# Patient Record
Sex: Female | Born: 2000 | Race: White | Hispanic: No | Marital: Single | Smoking: Never smoker
Health system: Southern US, Community
[De-identification: ages and names within clinical notes are randomized; demographics above are authoritative.]

## PROBLEM LIST (undated history)

## (undated) DIAGNOSIS — J329 Chronic sinusitis, unspecified: Secondary | ICD-10-CM

## (undated) HISTORY — PX: NO PAST SURGERIES: SHX2092

## (undated) HISTORY — PX: NASAL SINUS SURGERY: SHX719

---

## 2019-07-12 ENCOUNTER — Other Ambulatory Visit: Payer: Self-pay

## 2019-07-12 DIAGNOSIS — Z20822 Contact with and (suspected) exposure to covid-19: Secondary | ICD-10-CM

## 2019-07-13 LAB — NOVEL CORONAVIRUS, NAA: SARS-CoV-2, NAA: NOT DETECTED

## 2019-07-13 LAB — SPECIMEN STATUS REPORT

## 2019-09-08 ENCOUNTER — Emergency Department
Admission: EM | Admit: 2019-09-08 | Discharge: 2019-09-08 | Disposition: A | Discharge status: Home / Self Care | Payer: BLUE CROSS/BLUE SHIELD | Attending: Emergency Medicine | Admitting: Emergency Medicine

## 2019-09-08 ENCOUNTER — Other Ambulatory Visit: Payer: Self-pay

## 2019-09-08 DIAGNOSIS — R300 Dysuria: Secondary | ICD-10-CM | POA: Diagnosis present

## 2019-09-08 DIAGNOSIS — N3001 Acute cystitis with hematuria: Secondary | ICD-10-CM | POA: Insufficient documentation

## 2019-09-08 LAB — URINALYSIS, COMPLETE (UACMP) WITH MICROSCOPIC
Bilirubin Urine: NEGATIVE
Glucose, UA: NEGATIVE mg/dL
Ketones, ur: NEGATIVE mg/dL
Nitrite: POSITIVE — AB
Protein, ur: 30 mg/dL — AB
RBC / HPF: >50 RBC/hpf — ABNORMAL HIGH (ref 0–5)
Specific Gravity, Urine: 1.008 (ref 1.005–1.030)
WBC, UA: >50 WBC/hpf — ABNORMAL HIGH (ref 0–5)
pH: 6 (ref 5.0–8.0)

## 2019-09-08 LAB — POCT PREGNANCY, URINE: Preg Test, Ur: NEGATIVE

## 2019-09-08 MED ORDER — CEPHALEXIN 500 MG PO CAPS: 500.0000 mg | ORAL_CAPSULE | Freq: Three times a day (TID) | ORAL | 0 refills | Status: AC

## 2019-09-08 NOTE — ED Notes (Signed)
Pt currently taking azo for feelings of a uti. Urine obtained, dipped and sent to lab

## 2019-09-08 NOTE — ED Triage Notes (Signed)
Pt to the er for possible UTI. Pt says she took AZO at home and is providing some relief. Pt reports bloated and heavy bladder, urinary frequency and urgency with little out put and burning. Pt is in no acute distress.

## 2019-09-08 NOTE — ED Provider Notes (Signed)
[facility] Emergency Department Provider Note  ____________________________________________  Time seen: Approximately 9:01 PM  I have reviewed the triage vital signs and the nursing notes.   HISTORY  Chief Complaint Urinary Tract Infection    HPI Janet Cole is a 18 y.o. female presents to the emergency department with dysuria, hematuria and increased urinary frequency for the past 24 hours.  Patient reports some suprapubic discomfort but no low back pain, nausea or vomiting.  She states that she has absolutely no concerns for STDs.  Patient reports that it has been several months since she had a urinary tract infection.  No prior history of pyelonephritis or nephrolithiasis.  No other alleviating measures have been attempted.        History reviewed. No pertinent past medical history.  There are no active problems to display for this patient.   History reviewed. No pertinent surgical history.  Prior to Admission medications   Medication Sig Start Date End Date Taking? Authorizing Provider  cephALEXin (KEFLEX) 500 MG capsule Take 1 capsule (500 mg total) by mouth 3 (three) times daily for 7 days. 09/08/19 09/15/19  Orvil Feil, PA-C    Allergies Bactrim [sulfamethoxazole-trimethoprim]  No family history on file.  Social History Social History   Tobacco Use  . Smoking status: Never Smoker  . Smokeless tobacco: Never Used  Substance Use Topics  . Alcohol use: Yes    Comment: occasionally  . Drug use: Never     Review of Systems  Constitutional: No fever/chills Eyes: No visual changes. No discharge ENT: No upper respiratory complaints. Cardiovascular: no chest pain. Respiratory: no cough. No SOB. Gastrointestinal: No abdominal pain.  No nausea, no vomiting.  No diarrhea.  No constipation. Genitourinary: Patient has dysuria, hematuria and increased urinary frequency. Musculoskeletal: Negative for musculoskeletal pain. Skin:  Negative for rash, abrasions, lacerations, ecchymosis. Neurological: Negative for headaches, focal weakness or numbness.  ____________________________________________   PHYSICAL EXAM:  VITAL SIGNS: ED Triage Vitals  Enc Vitals Group     BP 09/08/19 1954 123/87     Pulse Rate 09/08/19 1954 87     Resp 09/08/19 1954 18     Temp 09/08/19 1954 98.7 F (37.1 C)     Temp Source 09/08/19 1954 Oral     SpO2 09/08/19 1954 99 %     Weight 09/08/19 1950 130 lb (59 kg)     Height 09/08/19 1950 5\' 6"  (1.676 m)     Head Circumference --      Peak Flow --      Pain Score 09/08/19 1950 6     Pain Loc --      Pain Edu? --      Excl. in GC? --      Constitutional: Alert and oriented. Well appearing and in no acute distress. Eyes: Conjunctivae are normal. PERRL. EOMI. Head: Atraumatic. Cardiovascular: Normal rate, regular rhythm. Normal S1 and S2.  Good peripheral circulation. Respiratory: Normal respiratory effort without tachypnea or retractions. Lungs CTAB. Good air entry to the bases with no decreased or absent breath sounds. Gastrointestinal: Bowel sounds 4 quadrants. Soft and nontender to palpation. No guarding or rigidity. No palpable masses. No distention. No CVA tenderness. Musculoskeletal: Full range of motion to all extremities. No gross deformities appreciated. Neurologic:  Normal speech and language. No gross focal neurologic deficits are appreciated.  Skin:  Skin is warm, dry and intact. No rash noted. Psychiatric: Mood and affect are normal. Speech and behavior are normal. Patient exhibits  appropriate insight and judgement.   ____________________________________________   LABS (all labs ordered are listed, but only abnormal results are displayed)  Labs Reviewed  URINALYSIS, COMPLETE (UACMP) WITH MICROSCOPIC - Abnormal; Notable for the following components:      Result Value   Color, Urine AMBER (*)    APPearance HAZY (*)    Hgb urine dipstick LARGE (*)    Protein, ur  30 (*)    Nitrite POSITIVE (*)    Leukocytes,Ua MODERATE (*)    RBC / HPF >50 (*)    WBC, UA >50 (*)    Bacteria, UA RARE (*)    All other components within normal limits  POC URINE PREG, ED  POCT PREGNANCY, URINE   ____________________________________________  EKG   ____________________________________________  RADIOLOGY   No results found.  ____________________________________________    PROCEDURES  Procedure(s) performed:    Procedures    Medications - No data to display   ____________________________________________   INITIAL IMPRESSION / ASSESSMENT AND PLAN / ED COURSE  Pertinent labs & imaging results that were available during my care of the patient were reviewed by me and considered in my medical decision making (see chart for details).  Review of the [place] CSRS was performed in accordance of the [place] prior to dispensing any controlled drugs.           Assessment and plan Cystitis 18 year old female presents to the emergency department with 1 day of dysuria, hematuria and increased urinary frequency.  Vital signs were reassuring in triage.  On physical exam, patient had no CVA tenderness or suprapubic pain.  Urinalysis was concerning for cystitis with nitrates, blood and leuks.  Patient has no CVA tenderness, low back pain or nausea which would increase suspicion for pyelonephritis.  Patient was discharged with Keflex.  Return precautions were given.  All patient questions were answered.   ____________________________________________  FINAL CLINICAL IMPRESSION(S) / ED DIAGNOSES  Final diagnoses:  Acute cystitis with hematuria      NEW MEDICATIONS STARTED DURING THIS VISIT:  ED Discharge Orders         Ordered    cephALEXin (KEFLEX) 500 MG capsule  3 times daily     09/08/19 2100              This chart was dictated using voice recognition software/Dragon. Despite best efforts to proofread, errors can occur which can change the  meaning. Any change was purely unintentional.    Lannie Fields, PA-C 09/08/19 2103    Nena Polio, MD 09/08/19 [phone]

## 2019-12-05 ENCOUNTER — Other Ambulatory Visit: Payer: Self-pay

## 2019-12-05 ENCOUNTER — Ambulatory Visit
Admission: EM | Admit: 2019-12-05 | Discharge: 2019-12-05 | Disposition: A | Discharge status: Home / Self Care | Payer: BLUE CROSS/BLUE SHIELD | Attending: Emergency Medicine | Admitting: Emergency Medicine

## 2019-12-05 ENCOUNTER — Encounter: Payer: Self-pay | Admitting: Emergency Medicine

## 2019-12-05 DIAGNOSIS — J0100 Acute maxillary sinusitis, unspecified: Secondary | ICD-10-CM

## 2019-12-05 MED ORDER — AMOXICILLIN 875 MG PO TABS: 875.0000 mg | ORAL_TABLET | Freq: Two times a day (BID) | ORAL | 0 refills | Status: AC

## 2019-12-05 NOTE — ED Provider Notes (Signed)
Janet Cole    CSN: [id number] Arrival date & time: 12/05/19  1254      History   Chief Complaint Chief Complaint  Patient presents with  . Cough  . Nasal Congestion    HPI Janet Cole is a 19 y.o. female.   Patient presents with headache, nasal congestion, sinus pressure, cough productive of green-yellow phlegm x 3-4 days.  She also reports 1 episode of vomiting this morning due to the phlegm.  She denies fever, chills, sore throat, shortness of breath, diarrhea, rash, or other symptoms.  She has attempted treatment at home with OTC Mucinex and a Nettie pot.  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  Patient states she has a history of sinus infections and has previously been followed by ENT; she states her last infection was several months ago.  The history is provided by the patient.    History reviewed. No pertinent past medical history.  There are no problems to display for this patient.   Past Surgical History:  Procedure Laterality Date  . NO PAST SURGERIES      OB History   No obstetric history on file.      Home Medications    Prior to Admission medications   Medication Sig Start Date End Date Taking? Authorizing Provider  ESTARYLLA 0.25-35 MG-MCG tablet  06/14/19  Yes [provider]  amoxicillin (AMOXIL) 875 MG tablet Take 1 tablet (875 mg total) by mouth 2 (two) times daily for 7 days. 12/05/19 12/12/19  Mickie Bail, NP    Family History Family History  Problem Relation Age of Onset  . Healthy Mother   . Healthy Father     Social History Social History   Tobacco Use  . Smoking status: Never Smoker  . Smokeless tobacco: Never Used  Substance Use Topics  . Alcohol use: Yes    Comment: occasionally  . Drug use: Never     Allergies   Bactrim [sulfamethoxazole-trimethoprim]   Review of Systems Review of Systems  Constitutional: Negative for chills and fever.  HENT: Positive  for congestion and sinus pressure. Negative for ear pain and sore throat.   Eyes: Negative for pain and visual disturbance.  Respiratory: Positive for cough. Negative for shortness of breath.   Cardiovascular: Negative for chest pain and palpitations.  Gastrointestinal: Positive for vomiting. Negative for abdominal pain and diarrhea.  Genitourinary: Negative for dysuria and hematuria.  Musculoskeletal: Negative for arthralgias and back pain.  Skin: Negative for color change and rash.  Neurological: Positive for headaches. Negative for seizures and syncope.  All other systems reviewed and are negative.    Physical Exam Triage Vital Signs ED Triage Vitals  Enc Vitals Group     BP      Pulse      Resp      Temp      Temp src      SpO2      Weight      Height      Head Circumference      Peak Flow      Pain Score      Pain Loc      Pain Edu?      Excl. in GC?    No data found.  Updated Vital Signs BP 109/76 (BP Location: Left Arm)   Pulse (!) 116   Temp 99.6 F (37.6 C) (Oral)   Resp 18   Ht  5\' 6"  (1.676 m)   Wt 130 lb (59 kg)   LMP 11/30/2019 (Approximate)   SpO2 97%   BMI 20.98 kg/m   Visual Acuity Right Eye Distance:   Left Eye Distance:   Bilateral Distance:    Right Eye Near:   Left Eye Near:    Bilateral Near:     Physical Exam Vitals and nursing note reviewed.  Constitutional:      General: She is not in acute distress.    Appearance: She is well-developed.  HENT:     Head: Normocephalic and atraumatic.     Right Ear: Tympanic membrane normal.     Left Ear: Tympanic membrane normal.     Nose: Congestion and rhinorrhea present.     Mouth/Throat:     Mouth: Mucous membranes are moist.     Pharynx: Oropharynx is clear.  Eyes:     Conjunctiva/sclera: Conjunctivae normal.  Cardiovascular:     Rate and Rhythm: Normal rate and regular rhythm.     Heart sounds: No murmur.  Pulmonary:     Effort: Pulmonary effort is normal. No respiratory distress.      Breath sounds: Normal breath sounds. No wheezing or rhonchi.  Abdominal:     General: Bowel sounds are normal.     Palpations: Abdomen is soft.     Tenderness: There is no abdominal tenderness. There is no guarding or rebound.  Musculoskeletal:     Cervical back: Neck supple.  Skin:    General: Skin is warm and dry.     Findings: No rash.  Neurological:     General: No focal deficit present.     Mental Status: She is alert and oriented to person, place, and time.  Psychiatric:        Mood and Affect: Mood normal.        Behavior: Behavior normal.      UC Treatments / Results  Labs (all labs ordered are listed, but only abnormal results are displayed) Labs Reviewed - No data to display  EKG   Radiology No results found.  Procedures Procedures (including critical care time)  Medications Ordered in UC Medications - No data to display  Initial Impression / Assessment and Plan / UC Course  I have reviewed the triage vital signs and the nursing notes.  Pertinent labs & imaging results that were available during my care of the patient were reviewed by me and considered in my medical decision making (see chart for details).    Acute sinusitis.  Treating with amoxicillin, Mucinex, ibuprofen.  Instructed patient to follow up with her PCP or ENT if her symptoms are not improving.  Patient agrees to plan of care.    Final Clinical Impressions(s) / UC Diagnoses   Final diagnoses:  Acute non-recurrent maxillary sinusitis     Discharge Instructions     Take the amoxicillin as directed.  Additionally you can take over-the-counter Mucinex and ibuprofen.    Follow up with your primary care provider or ENT if your symptoms are not improving.        ED Prescriptions    Medication Sig Dispense Auth. Provider   amoxicillin (AMOXIL) 875 MG tablet Take 1 tablet (875 mg total) by mouth 2 (two) times daily for 7 days. 14 tablet Sharion Balloon, NP     PDMP not reviewed  this encounter.   Sharion Balloon, NP 12/05/19 1350

## 2019-12-05 NOTE — ED Triage Notes (Signed)
Pt c/o cough, nasal congestion, headache. Stated about 3 days ago. She states that her cough is productive and is a dark yellow/green. She states she had a covid test yesterday and was negative.

## 2019-12-05 NOTE — Discharge Instructions (Signed)
Take the amoxicillin as directed.  Additionally you can take over-the-counter Mucinex and ibuprofen.    Follow up with your primary care provider or ENT if your symptoms are not improving.

## 2020-06-23 ENCOUNTER — Other Ambulatory Visit: Payer: Self-pay

## 2020-06-23 ENCOUNTER — Ambulatory Visit
Admission: EM | Admit: 2020-06-23 | Discharge: 2020-06-23 | Disposition: A | Discharge status: Home / Self Care | Payer: BLUE CROSS/BLUE SHIELD | Attending: Family Medicine | Admitting: Family Medicine

## 2020-06-23 DIAGNOSIS — J0111 Acute recurrent frontal sinusitis: Secondary | ICD-10-CM | POA: Diagnosis not present

## 2020-06-23 DIAGNOSIS — H9201 Otalgia, right ear: Secondary | ICD-10-CM

## 2020-06-23 MED ORDER — AMOXICILLIN-POT CLAVULANATE 875-125 MG PO TABS: 1.0000 | ORAL_TABLET | Freq: Two times a day (BID) | ORAL | 0 refills | Status: DC

## 2020-06-23 NOTE — Discharge Instructions (Addendum)
Take the antibiotics as prescribed for sinus infection and mild right ear infection. Flonase nasal spray Over-the-counter medicines as needed Follow up as needed for continued or worsening symptoms

## 2020-06-23 NOTE — ED Triage Notes (Signed)
Patient reports head, nasal, and chest congestion x4 days. Reports she regularly sees an ENT at home and has chronic sinusitis. Agreeable to covid testing.

## 2020-06-24 NOTE — ED Provider Notes (Signed)
[facility]    CSN: [id number] Arrival date & time: 06/23/20  1744      History   Chief Complaint Chief Complaint  Patient presents with  . Nasal Congestion  . Sinus Problem  . nasal drainage    HPI Janet Cole is a 19 y.o. female.   19 year old female who presents today with nasal congestion, chest congestion, headache x4 days.  History of chronic sinusitis.  She is also had some throat irritation, pain with swallowing reporting pain in teeth and pressure behind eyes.  Mild low-grade fever.  Using allergy medication without any relief.  No known sick contacts.     History reviewed. No pertinent past medical history.  There are no problems to display for this patient.   Past Surgical History:  Procedure Laterality Date  . NO PAST SURGERIES      OB History   No obstetric history on file.      Home Medications    Prior to Admission medications   Medication Sig Start Date End Date Taking? Authorizing Provider  amoxicillin-clavulanate (AUGMENTIN) 875-125 MG tablet Take 1 tablet by mouth every 12 (twelve) hours. 06/23/20   Janace Aris, NP  ESTARYLLA 0.25-35 MG-MCG tablet  06/14/19   [provider]    Family History Family History  Problem Relation Age of Onset  . Healthy Mother   . Healthy Father     Social History Social History   Tobacco Use  . Smoking status: Never Smoker  . Smokeless tobacco: Never Used  Vaping Use  . Vaping Use: Never used  Substance Use Topics  . Alcohol use: Yes    Comment: occasionally  . Drug use: Never     Allergies   Bactrim [sulfamethoxazole-trimethoprim]   Review of Systems Review of Systems   Physical Exam Triage Vital Signs ED Triage Vitals  Enc Vitals Group     BP 06/23/20 1754 126/89     Pulse Rate 06/23/20 1754 89     Resp 06/23/20 1754 14     Temp 06/23/20 1754 99.2 F (37.3 C)     Temp src --      SpO2 06/23/20 1754 98 %     Weight --      Height --      Head  Circumference --      Peak Flow --      Pain Score 06/23/20 1752 7     Pain Loc --      Pain Edu? --      Excl. in GC? --    No data found.  Updated Vital Signs BP 126/89   Pulse 89   Temp 99.2 F (37.3 C)   Resp 14   LMP 06/09/2020 (Within Days)   SpO2 98%   Visual Acuity Right Eye Distance:   Left Eye Distance:   Bilateral Distance:    Right Eye Near:   Left Eye Near:    Bilateral Near:     Physical Exam Constitutional:      General: She is not in acute distress.    Appearance: Normal appearance. She is ill-appearing. She is not toxic-appearing or diaphoretic.  HENT:     Right Ear: Ear canal normal. Tympanic membrane is erythematous and retracted.     Left Ear: Tympanic membrane and ear canal normal.     Nose: Congestion and rhinorrhea present.     Right Sinus: Maxillary sinus tenderness and frontal sinus tenderness present.     Left Sinus:  Maxillary sinus tenderness and frontal sinus tenderness present.     Mouth/Throat:     Pharynx: Oropharyngeal exudate and posterior oropharyngeal erythema present.  Cardiovascular:     Rate and Rhythm: Normal rate and regular rhythm.     Pulses: Normal pulses.     Heart sounds: Normal heart sounds.  Pulmonary:     Effort: Pulmonary effort is normal.     Breath sounds: Normal breath sounds.  Neurological:     Mental Status: She is alert.      UC Treatments / Results  Labs (all labs ordered are listed, but only abnormal results are displayed) Labs Reviewed  NOVEL CORONAVIRUS, NAA    EKG   Radiology No results found.  Procedures Procedures (including critical care time)  Medications Ordered in UC Medications - No data to display  Initial Impression / Assessment and Plan / UC Course  I have reviewed the triage vital signs and the nursing notes.  Pertinent labs & imaging results that were available during my care of the patient were reviewed by me and considered in my medical decision making (see chart for  details).     Acute recurrent frontal sinusitis with right otitis media Recommended continue Flonase nasal spray Treating with amoxicillin Over-the-counter medicines for symptoms as needed for fever, pain Follow up as needed for continued or worsening symptoms covid swab pending.   Final Clinical Impressions(s) / UC Diagnoses   Final diagnoses:  Acute recurrent frontal sinusitis  Ear pain, right     Discharge Instructions     Take the antibiotics as prescribed for sinus infection and mild right ear infection. Flonase nasal spray Over-the-counter medicines as needed Follow up as needed for continued or worsening symptoms     ED Prescriptions    Medication Sig Dispense Auth. Provider   amoxicillin-clavulanate (AUGMENTIN) 875-125 MG tablet Take 1 tablet by mouth every 12 (twelve) hours. 14 tablet Katiejo Gilroy A, NP     PDMP not reviewed this encounter.   Janace Aris, NP 06/24/20 1443

## 2020-06-25 LAB — NOVEL CORONAVIRUS, NAA: SARS-CoV-2, NAA: DETECTED — AB

## 2020-07-17 ENCOUNTER — Ambulatory Visit
Admission: RE | Admit: 2020-07-17 | Discharge: 2020-07-17 | Disposition: A | Discharge status: Home / Self Care | Payer: BLUE CROSS/BLUE SHIELD | Source: Ambulatory Visit | Attending: Family Medicine | Admitting: Family Medicine

## 2020-07-17 ENCOUNTER — Other Ambulatory Visit: Payer: Self-pay

## 2020-07-17 VITALS — BP 117/82 | HR 95 | Temp 98.8°F | Resp 16

## 2020-07-17 DIAGNOSIS — J329 Chronic sinusitis, unspecified: Secondary | ICD-10-CM

## 2020-07-17 MED ORDER — CEFDINIR 300 MG PO CAPS: 300.0000 mg | ORAL_CAPSULE | Freq: Two times a day (BID) | ORAL | 0 refills | Status: AC

## 2020-07-17 NOTE — ED Provider Notes (Signed)
[facility]   [id number] 07/17/20 Arrival Time: 1422  NW:GNFA THROAT  SUBJECTIVE: History from: patient.  Janet Cole is a 19 y.o. female who presents with abrupt onset of nasal congestion, headache, fatigue for the last 2 weeks. Reports that she had a sinus infection and Covid about a month ago. Denies sick exposure to Covid, strep, flu or mono, or precipitating event. Has not had Covid vaccines. Has been taking OTC cough and cold with no relief. There are no aggravating symptoms. Denies previous symptoms in the past.     Denies fever, chills, ear pain, rhinorrhea, cough, SOB, wheezing, chest pain, nausea, rash, changes in bowel or bladder habits.    ROS: As per HPI.  All other pertinent ROS negative.     History reviewed. No pertinent past medical history. Past Surgical History:  Procedure Laterality Date  . NO PAST SURGERIES     Allergies  Allergen Reactions  . Bactrim [Sulfamethoxazole-Trimethoprim]    No current facility-administered medications on file prior to encounter.   Current Outpatient Medications on File Prior to Encounter  Medication Sig Dispense Refill  . ESTARYLLA 0.25-35 MG-MCG tablet      Social History   Socioeconomic History  . Marital status: Single    Spouse name: Not on file  . Number of children: Not on file  . Years of education: Not on file  . Highest education level: Not on file  Occupational History  . Not on file  Tobacco Use  . Smoking status: Never Smoker  . Smokeless tobacco: Never Used  Vaping Use  . Vaping Use: Never used  Substance and Sexual Activity  . Alcohol use: Yes    Comment: occasionally  . Drug use: Never  . Sexual activity: Yes    Birth control/protection: Pill  Other Topics Concern  . Not on file  Social History Narrative  . Not on file   Social Determinants of Health   Financial Resource Strain:   . Difficulty of Paying Living Expenses: Not on file  Food Insecurity:   . Worried About Patent examiner in the Last Year: Not on file  . Ran Out of Food in the Last Year: Not on file  Transportation Needs:   . Lack of Transportation (Medical): Not on file  . Lack of Transportation (Non-Medical): Not on file  Physical Activity:   . Days of Exercise per Week: Not on file  . Minutes of Exercise per Session: Not on file  Stress:   . Feeling of Stress : Not on file  Social Connections:   . Frequency of Communication with Friends and Family: Not on file  . Frequency of Social Gatherings with Friends and Family: Not on file  . Attends Religious Services: Not on file  . Active Member of Clubs or Organizations: Not on file  . Attends Banker Meetings: Not on file  . Marital Status: Not on file  Intimate Partner Violence:   . Fear of Current or Ex-Partner: Not on file  . Emotionally Abused: Not on file  . Physically Abused: Not on file  . Sexually Abused: Not on file   Family History  Problem Relation Age of Onset  . Healthy Mother   . Healthy Father     OBJECTIVE:  Vitals:   07/17/20 1430  BP: 117/82  Pulse: 95  Resp: 16  Temp: 98.8 F (37.1 C)  SpO2: 98%     General appearance: alert; appears fatigued, but nontoxic, speaking in  full sentences and managing own secretions HEENT: NCAT; Ears: EACs clear, TMs pearly gray with visible cone of light, without erythema; Eyes: PERRL, EOMI grossly; Nose: no obvious rhinorrhea; Throat: oropharynx clear, tonsils 1+ and mildly erythematous without white tonsillar exudates, uvula midline; Sinuses: maxillary sinuses tender to palpation Neck: supple without LAD Lungs: CTA bilaterally without adventitious breath sounds; cough absent Heart: regular rate and rhythm.  Radial pulses 2+ symmetrical bilaterally Skin: warm and dry Psychological: alert and cooperative; normal mood and affect  LABS: No results found for this or any previous visit (from the past 24 hour(s)).   ASSESSMENT & PLAN:  1. Acute non-recurrent maxillary  sinusitis     Meds ordered this encounter  Medications  . cefdinir (OMNICEF) 300 MG capsule    Sig: Take 1 capsule (300 mg total) by mouth 2 (two) times daily for 10 days.    Dispense:  20 capsule    Refill:  0    Order Specific Question:   Supervising Provider    Answer:   Merrilee Jansky [id number]    Acute Sinusitis Push fluids and get rest Prescribed cefdinir Take as directed and to completion.  Drink warm or cool liquids, use throat lozenges, or popsicles to help alleviate symptoms Take OTC ibuprofen or tylenol as needed for pain May use Zyrtec D and flonase to help alleviate symptoms Follow up with PCP if symptoms persist Return or go to ER if you have any new or worsening symptoms such as fever, chills, nausea, vomiting, worsening sore throat, cough, abdominal pain, chest pain, changes in bowel or bladder habits.   Reviewed expectations re: course of current medical issues. Questions answered. Outlined signs and symptoms indicating need for more acute intervention. Patient verbalized understanding. After Visit Summary given.          Moshe Cipro, NP 07/17/20 1444

## 2020-07-17 NOTE — ED Triage Notes (Signed)
Patient reports she tested positive for covid in late august. Reports after her 10 day quarantine, she was starting to feel better; reports that she began developing nasal congestion, cough, and swollen glands in her neck about a week later.

## 2020-07-17 NOTE — Discharge Instructions (Addendum)
I have sent in cefdinir for you to take twice a day for 10 days.  Follow up with ENT as needed

## 2020-08-18 ENCOUNTER — Ambulatory Visit
Admission: EM | Admit: 2020-08-18 | Discharge: 2020-08-18 | Disposition: A | Discharge status: Home / Self Care | Payer: BLUE CROSS/BLUE SHIELD | Attending: Family Medicine | Admitting: Family Medicine

## 2020-08-18 ENCOUNTER — Encounter: Payer: Self-pay | Admitting: *Deleted

## 2020-08-18 DIAGNOSIS — R519 Headache, unspecified: Secondary | ICD-10-CM

## 2020-08-18 DIAGNOSIS — R509 Fever, unspecified: Secondary | ICD-10-CM

## 2020-08-18 DIAGNOSIS — J020 Streptococcal pharyngitis: Secondary | ICD-10-CM

## 2020-08-18 DIAGNOSIS — R000 Tachycardia, unspecified: Secondary | ICD-10-CM

## 2020-08-18 LAB — POCT RAPID STREP A (OFFICE): Rapid Strep A Screen: POSITIVE — AB

## 2020-08-18 MED ORDER — PREDNISONE 10 MG (21) PO TBPK: ORAL_TABLET | Freq: Every day | ORAL | 0 refills | Status: AC

## 2020-08-18 MED ORDER — AMOXICILLIN 500 MG PO TABS: 500.0000 mg | ORAL_TABLET | Freq: Two times a day (BID) | ORAL | 0 refills | Status: AC

## 2020-08-18 NOTE — ED Triage Notes (Signed)
Patient reports sore throat, fever, migraine and nasal congestion since Friday.   COVID positive in August/September.   OTC: advil, musinex, and netti pot

## 2020-08-18 NOTE — ED Provider Notes (Signed)
[facility]   [id number] 08/18/20 Arrival Time: 1022  FG:HWEX THROAT  SUBJECTIVE: History from: patient.  Janet Cole is a 19 y.o. female who presents with abrupt onset of sore throat for the last 4 days. Denies sick exposure to Covid, strep, flu or mono, or precipitating event. Has tried ibuprofen without relief. Symptoms are made worse with swallowing, but tolerating liquids and own secretions without difficulty.  Denies previous symptoms in the past.     Denies  ear pain, sinus pain, rhinorrhea, nasal congestion, cough, SOB, wheezing, chest pain, nausea, rash, changes in bowel or bladder habits.     ROS: As per HPI.  All other pertinent ROS negative.     History reviewed. No pertinent past medical history. Past Surgical History:  Procedure Laterality Date  . NO PAST SURGERIES     Allergies  Allergen Reactions  . Bactrim [Sulfamethoxazole-Trimethoprim]    No current facility-administered medications on file prior to encounter.   Current Outpatient Medications on File Prior to Encounter  Medication Sig Dispense Refill  . ESTARYLLA 0.25-35 MG-MCG tablet      Social History   Socioeconomic History  . Marital status: Single    Spouse name: Not on file  . Number of children: Not on file  . Years of education: Not on file  . Highest education level: Not on file  Occupational History  . Not on file  Tobacco Use  . Smoking status: Never Smoker  . Smokeless tobacco: Never Used  Vaping Use  . Vaping Use: Never used  Substance and Sexual Activity  . Alcohol use: Yes    Comment: occasionally  . Drug use: Never  . Sexual activity: Yes    Birth control/protection: Pill  Other Topics Concern  . Not on file  Social History Narrative  . Not on file   Social Determinants of Health   Financial Resource Strain:   . Difficulty of Paying Living Expenses: Not on file  Food Insecurity:   . Worried About Programme researcher, broadcasting/film/video in the Last Year: Not on file  . Ran  Out of Food in the Last Year: Not on file  Transportation Needs:   . Lack of Transportation (Medical): Not on file  . Lack of Transportation (Non-Medical): Not on file  Physical Activity:   . Days of Exercise per Week: Not on file  . Minutes of Exercise per Session: Not on file  Stress:   . Feeling of Stress : Not on file  Social Connections:   . Frequency of Communication with Friends and Family: Not on file  . Frequency of Social Gatherings with Friends and Family: Not on file  . Attends Religious Services: Not on file  . Active Member of Clubs or Organizations: Not on file  . Attends Banker Meetings: Not on file  . Marital Status: Not on file  Intimate Partner Violence:   . Fear of Current or Ex-Partner: Not on file  . Emotionally Abused: Not on file  . Physically Abused: Not on file  . Sexually Abused: Not on file   Family History  Problem Relation Age of Onset  . Healthy Mother   . Healthy Father     OBJECTIVE:  Vitals:   08/18/20 1035  BP: 122/79  Pulse: (!) 123  Resp: 16  Temp: 99.6 F (37.6 C)  TempSrc: Oral  SpO2: 98%     General appearance: alert; appears fatigued, but nontoxic, speaking in full sentences and managing own secretions HEENT:  NCAT; Ears: EACs clear, TMs pearly gray with visible cone of light, without erythema; Eyes: PERRL, EOMI grossly; Nose: no obvious rhinorrhea; Throat: oropharynx erythematous with petechiae, tonsils 2+ and erythematous without white tonsillar exudates, uvula midline Neck: supple with LAD Lungs: CTA bilaterally without adventitious breath sounds; cough absent Heart: regular rate and rhythm.  Radial pulses 2+ symmetrical bilaterally Skin: warm and dry Psychological: alert and cooperative; normal mood and affect  LABS: Results for orders placed or performed during the hospital encounter of 08/18/20 (from the past 24 hour(s))  POCT rapid strep A     Status: Abnormal   Collection Time: 08/18/20 10:55 AM  Result  Value Ref Range   Rapid Strep A Screen Positive (A) Negative     ASSESSMENT & PLAN:  1. Strep throat   2. Fever, unspecified fever cause   3. Nonintractable headache, unspecified chronicity pattern, unspecified headache type   4. Tachycardia     Meds ordered this encounter  Medications  . amoxicillin (AMOXIL) 500 MG tablet    Sig: Take 1 tablet (500 mg total) by mouth 2 (two) times daily for 7 days.    Dispense:  14 tablet    Refill:  0    Order Specific Question:   Supervising Provider    Answer:   Merrilee Jansky [id number]  . predniSONE (STERAPRED UNI-PAK 21 TAB) 10 MG (21) TBPK tablet    Sig: Take by mouth daily for 6 days. Take 6 tablets on day 1, 5 tablets on day 2, 4 tablets on day 3, 3 tablets on day 4, 2 tablets on day 5, 1 tablet on day 6    Dispense:  21 tablet    Refill:  0    Order Specific Question:   Supervising Provider    Answer:   Merrilee Jansky [id number]    Strep was positive.  Push fluids and get rest Prescribed amoxicillin 500mg  twice daily for 7 days.   Steroid taper prescribed Take as directed and to completion.  Drink warm or cool liquids, use throat lozenges, or popsicles to help alleviate symptoms Take OTC ibuprofen or tylenol as needed for pain Follow up with PCP if symptoms persist Return or go to ER if you have any new or worsening symptoms such as fever, chills, nausea, vomiting, worsening sore throat, cough, abdominal pain, chest pain, changes in bowel or bladder habits  Strep test negative, will send out for culture and we will call you with results Declines test for mono at this time Get plenty of rest and push fluids Take OTC Zyrtec and use chloraseptic spray as needed for throat pain. Drink warm or cool liquids, use throat lozenges, or popsicles to help alleviate symptoms Take OTC ibuprofen or tylenol as needed for pain Follow up with PCP if symptoms persists Return or go to ER if patient has any new or worsening symptoms such as  fever, chills, nausea, vomiting, worsening sore throat, cough, abdominal pain, chest pain, changes in bowel or bladder habits  Reviewed expectations re: course of current medical issues. Questions answered. Outlined signs and symptoms indicating need for more acute intervention. Patient verbalized understanding. After Visit Summary given.          , NP 08/18/20 1114

## 2020-08-18 NOTE — Discharge Instructions (Addendum)
I have sent in Amoxicillin for you to take twice a day for 7 days  I have sent in a prednisone taper for you to take for 6 days. 6 tablets on day one, 5 tablets on day two, 4 tablets on day three, 3 tablets on day four, 2 tablets on day five, and 1 tablet on day six.  Follow up with this office or with primary care if symptoms are persisting.  Follow up in the ER for high fever, trouble swallowing, trouble breathing, other concerning symptoms.

## 2022-01-15 ENCOUNTER — Ambulatory Visit
Admission: RE | Admit: 2022-01-15 | Discharge: 2022-01-15 | Disposition: A | Discharge status: Home / Self Care | Payer: BLUE CROSS/BLUE SHIELD | Source: Ambulatory Visit

## 2022-01-15 ENCOUNTER — Ambulatory Visit (INDEPENDENT_AMBULATORY_CARE_PROVIDER_SITE_OTHER): Payer: BLUE CROSS/BLUE SHIELD

## 2022-01-15 ENCOUNTER — Other Ambulatory Visit: Payer: Self-pay

## 2022-01-15 VITALS — BP 120/86 | HR 115 | Temp 99.1°F | Resp 18

## 2022-01-15 DIAGNOSIS — J45901 Unspecified asthma with (acute) exacerbation: Secondary | ICD-10-CM | POA: Diagnosis not present

## 2022-01-15 DIAGNOSIS — J0100 Acute maxillary sinusitis, unspecified: Secondary | ICD-10-CM | POA: Diagnosis not present

## 2022-01-15 DIAGNOSIS — R059 Cough, unspecified: Secondary | ICD-10-CM

## 2022-01-15 HISTORY — DX: Chronic sinusitis, unspecified: J32.9

## 2022-01-15 LAB — POCT RAPID STREP A (OFFICE): Rapid Strep A Screen: NEGATIVE

## 2022-01-15 MED ORDER — AMOXICILLIN-POT CLAVULANATE 875-125 MG PO TABS: 1.0000 | ORAL_TABLET | Freq: Two times a day (BID) | ORAL | 0 refills | Status: AC

## 2022-01-15 MED ORDER — PREDNISONE 10 MG (21) PO TBPK: ORAL_TABLET | Freq: Every day | ORAL | 0 refills | Status: AC

## 2022-01-15 NOTE — Discharge Instructions (Addendum)
Stop the doxycycline.  Start the Augmentin and prednisone.  Continue using your albuterol inhaler.   ? ?Follow up with your primary care provider if your symptoms are not improving.   ? ? ?

## 2022-01-15 NOTE — ED Provider Notes (Signed)
?[facility] ? ? ? ?CSN: [id number] ?Arrival date & time: 01/15/22  1021 ? ? ?  ? ?History   ?Chief Complaint ?Chief Complaint  ?Patient presents with  ? Nasal Congestion  ?  For 2 weeks I had nasal congestion, thought it was a cold or allergies, until last Thursday 3/16 it got worse where I was coughing up dark green mucus and could not breathe through my nose. I've had a low grade fever 100.9 for the past 3 days as well - Entered by patient  ? Otalgia  ? Sinus Pressure  ? ? ?HPI ?Janet Cole is a 21 y.o. female.  Patient presents with 1 week history of low-grade fever, sinus pressure, sinus congestion, runny nose, postnasal drip, cough productive of dark green sputum.  She also reports ear pain and sore throat.  She has had some diarrhea; no vomiting.  [facility] and treated with doxycycline; she has taken 4 days of this without improvement.  COVID negative at that time.  She has been taking Zyrtec and Sudafed for her symptoms also.  She reports history of asthma.  She has been using her albuterol inhaler 3 times daily. ? ?The history is provided by the patient.  ? ?Past Medical History:  ?Diagnosis Date  ? Sinusitis   ? ? ?There are no problems to display for this patient. ? ? ?Past Surgical History:  ?Procedure Laterality Date  ? NASAL SINUS SURGERY    ? NO PAST SURGERIES    ? ? ?OB History   ?No obstetric history on file. ?  ? ? ? ?Home Medications   ? ?Prior to Admission medications   ?Medication Sig Start Date End Date Taking? Authorizing Provider  ?amoxicillin-clavulanate (AUGMENTIN) 875-125 MG tablet Take 1 tablet by mouth every 12 (twelve) hours for 10 days. 01/15/22 01/25/22 Yes Mickie Bail, NP  ?Cetirizine HCl (ZYRTEC ALLERGY PO) Take by mouth.   Yes [provider]  ?ESTARYLLA 0.25-35 MG-MCG tablet  06/14/19  Yes [provider]  ?predniSONE (STERAPRED UNI-PAK 21 TAB) 10 MG (21) TBPK tablet Take by mouth daily. As directed 01/15/22  Yes Mickie Bail, NP  ? ? ?Family History ?Family History  ?Problem Relation Age of Onset  ? Healthy Mother   ? Healthy Father   ? ? ?Social History ?Social History  ? ?Tobacco Use  ? Smoking status: Never  ? Smokeless tobacco: Never  ?Vaping Use  ? Vaping Use: Never used  ?Substance Use Topics  ? Alcohol use: Yes  ?  Comment: occasionally  ? Drug use: Never  ? ? ? ?Allergies   ?Bactrim [sulfamethoxazole-trimethoprim] ? ? ?Review of Systems ?Review of Systems  ?Constitutional:  Positive for fever. Negative for chills.  ?HENT:  Positive for congestion, ear pain, postnasal drip, rhinorrhea, sinus pressure and sore throat.   ?Respiratory:  Positive for cough. Negative for shortness of breath.   ?Cardiovascular:  Negative for chest pain and palpitations.  ?Gastrointestinal:  Positive for diarrhea. Negative for abdominal pain and vomiting.  ?Skin:  Negative for color change and rash.  ?All other systems reviewed and are negative. ? ? ?Physical Exam ?Triage Vital Signs ?ED Triage Vitals [01/15/22 1042]  ?Enc Vitals Group  ?   BP 120/86  ?   Pulse Rate (!) 115  ?   Resp 18  ?   Temp 99.1 ?F (37.3 ?C)  ?   Temp src   ?   SpO2 98 %  ?  Weight   ?   Height   ?   Head Circumference   ?   Peak Flow   ?   Pain Score   ?   Pain Loc   ?   Pain Edu?   ?   Excl. in [place]?   ? ?No data found. ? ?Updated Vital Signs ?BP 120/86   Pulse (!) 115   Temp 99.1 ?F (37.3 ?C)   Resp 18   LMP 01/13/2022   SpO2 98%  ? ?Visual Acuity ?Right Eye Distance:   ?Left Eye Distance:   ?Bilateral Distance:   ? ?Right Eye Near:   ?Left Eye Near:    ?Bilateral Near:    ? ?Physical Exam ?Vitals and nursing note reviewed.  ?Constitutional:   ?   General: She is not in acute distress. ?   Appearance: She is well-developed. She is ill-appearing.  ?HENT:  ?   Right Ear: Tympanic membrane is erythematous.  ?   Left Ear: Tympanic membrane is erythematous.  ?   Nose: Congestion and rhinorrhea present.  ?   Mouth/Throat:  ?   Mouth: Mucous membranes are moist.  ?    Pharynx: Oropharyngeal exudate and posterior oropharyngeal erythema present.  ?Cardiovascular:  ?   Rate and Rhythm: Normal rate and regular rhythm.  ?   Heart sounds: Normal heart sounds.  ?Pulmonary:  ?   Effort: Pulmonary effort is normal. No respiratory distress.  ?   Breath sounds: Wheezing and rhonchi present.  ?Abdominal:  ?   Palpations: Abdomen is soft.  ?   Tenderness: There is no abdominal tenderness.  ?Musculoskeletal:  ?   Cervical back: Neck supple.  ?Skin: ?   General: Skin is warm and dry.  ?Neurological:  ?   Mental Status: She is alert.  ?Psychiatric:     ?   Mood and Affect: Mood normal.     ?   Behavior: Behavior normal.  ? ? ? ?UC Treatments / Results  ?Labs ?(all labs ordered are listed, but only abnormal results are displayed) ?Labs Reviewed  ?POCT RAPID STREP A (OFFICE)  ? ? ?EKG ? ? ?Radiology ?DG Chest 2 View ? ?Result Date: 01/15/2022 ?CLINICAL DATA:  Productive cough EXAM: CHEST - 2 VIEW COMPARISON:  None. FINDINGS: Slightly overpenetrated examination. The heart size and mediastinal contours are within normal limits. No focal airspace consolidation, pleural effusion, or pneumothorax. S-shaped scoliotic spinal curvature. IMPRESSION: No active cardiopulmonary disease. Electronically Signed   By: Duanne Guess D.O.   On: 01/15/2022 11:46   ? ?Procedures ?Procedures (including critical care time) ? ?Medications Ordered in UC ?Medications - No data to display ? ?Initial Impression / Assessment and Plan / UC Course  ?I have reviewed the triage vital signs and the nursing notes. ? ?Pertinent labs & imaging results that were available during my care of the patient were reviewed by me and considered in my medical decision making (see chart for details). ? ?  ?Acute asthma exacerbation, acute sinusitis.  Rapid strep negative.  Chest x-ray negative.  Instructed patient to stop taking the doxycycline.  Starting Augmentin and prednisone taper.  Instructed her to continue using her albuterol inhaler.   Discussed Tylenol or ibuprofen as needed for fever or discomfort.  Rest and hydration.  Instructed her to follow-up with her PCP if her symptoms are not improving.  She agrees to plan of care. ? ?Final Clinical Impressions(s) / UC Diagnoses  ? ?Final diagnoses:  ?Asthma with acute exacerbation,  unspecified asthma severity, unspecified whether persistent  ?Acute non-recurrent maxillary sinusitis  ? ? ? ?Discharge Instructions   ? ?  ?Stop the doxycycline.  Start the Augmentin and prednisone.  Continue using your albuterol inhaler.   ? ?Follow up with your primary care provider if your symptoms are not improving.   ? ? ? ? ? ? ?ED Prescriptions   ? ? Medication Sig Dispense Auth. Provider  ? amoxicillin-clavulanate (AUGMENTIN) 875-125 MG tablet Take 1 tablet by mouth every 12 (twelve) hours for 10 days. 20 tablet Mickie Bailate, Keondre Markson H, NP  ? predniSONE (STERAPRED UNI-PAK 21 TAB) 10 MG (21) TBPK tablet Take by mouth daily. As directed 21 tablet Mickie Bailate, Selma Mink H, NP  ? ?  ? ?PDMP not reviewed this encounter. ?  ?Mickie Bailate, Kista Robb H, NP ?01/15/22 1209 ? ?

## 2022-01-15 NOTE — ED Triage Notes (Signed)
Pt presents with cough, stuffy nose, sinus pressure and low grade fever for over a week. [facility] and prescribe doxycycline. Pt states her symptoms are getting worse. Her Covid test was negative.  ?

## 2022-11-20 ENCOUNTER — Ambulatory Visit
Admission: EM | Admit: 2022-11-20 | Discharge: 2022-11-20 | Disposition: A | Discharge status: Home / Self Care | Payer: BLUE CROSS/BLUE SHIELD | Attending: Emergency Medicine | Admitting: Emergency Medicine

## 2022-11-20 DIAGNOSIS — N390 Urinary tract infection, site not specified: Secondary | ICD-10-CM | POA: Diagnosis present

## 2022-11-20 DIAGNOSIS — Z3202 Encounter for pregnancy test, result negative: Secondary | ICD-10-CM | POA: Diagnosis not present

## 2022-11-20 LAB — POCT URINALYSIS DIP (MANUAL ENTRY)
Glucose, UA: 100 mg/dL — AB
Nitrite, UA: POSITIVE — AB
Protein Ur, POC: 100 mg/dL — AB
Spec Grav, UA: 1.015 (ref 1.010–1.025)
Urobilinogen, UA: 2 E.U./dL — AB
pH, UA: 6 (ref 5.0–8.0)

## 2022-11-20 LAB — POCT URINE PREGNANCY: Preg Test, Ur: NEGATIVE

## 2022-11-20 MED ORDER — CEPHALEXIN 500 MG PO CAPS: 500.0000 mg | ORAL_CAPSULE | Freq: Two times a day (BID) | ORAL | 0 refills | Status: AC

## 2022-11-20 NOTE — ED Provider Notes (Signed)
[name]    CSN: [id number] Arrival date & time: 11/20/22  1403      History   Chief Complaint Chief Complaint  Patient presents with   Urinary Frequency    UTI symptoms - Entered by patient    HPI Janet Cole is a 22 y.o. female.  Patient presents with 1 day history of dysuria and frequency.  No fever, chills, abdominal pain, flank pain, vaginal discharge, pelvic pain, or other symptoms.  Treatment at home with Azo.  Patient reports she completed a 7 day course of Augmentin yesterday that was prescribed by her PCP in [place] for a sinus infection.  She denies other medical history.   She denies current pregnancy or breastfeeding.     The history is provided by the patient and medical records.    Past Medical History:  Diagnosis Date   Sinusitis     There are no problems to display for this patient.   Past Surgical History:  Procedure Laterality Date   NASAL SINUS SURGERY     NO PAST SURGERIES      OB History   No obstetric history on file.      Home Medications    Prior to Admission medications   Medication Sig Start Date End Date Taking? Authorizing Provider  cephALEXin (KEFLEX) 500 MG capsule Take 1 capsule (500 mg total) by mouth 2 (two) times daily for 5 days. 11/20/22 11/25/22 Yes Sharion Balloon, NP  Cetirizine HCl (ZYRTEC ALLERGY PO) Take by mouth.    [provider]  Raye Sorrow 0.25-35 MG-MCG tablet  06/14/19   [provider]  predniSONE (STERAPRED UNI-PAK 21 TAB) 10 MG (21) TBPK tablet Take by mouth daily. As directed 01/15/22   Sharion Balloon, NP    Family History Family History  Problem Relation Age of Onset   Healthy Mother    Healthy Father     Social History Social History   Tobacco Use   Smoking status: Never   Smokeless tobacco: Never  Vaping Use   Vaping Use: Never used  Substance Use Topics   Alcohol use: Yes    Comment: occasionally   Drug use: Never     Allergies   Bactrim  [sulfamethoxazole-trimethoprim]   Review of Systems Review of Systems  Constitutional:  Negative for chills and fever.  Gastrointestinal:  Negative for abdominal pain, nausea and vomiting.  Genitourinary:  Positive for dysuria and frequency. Negative for flank pain, hematuria, pelvic pain and vaginal discharge.     Physical Exam Triage Vital Signs ED Triage Vitals  Enc Vitals Group     BP      Pulse      Resp      Temp      Temp src      SpO2      Weight      Height      Head Circumference      Peak Flow      Pain Score      Pain Loc      Pain Edu?      Excl. in [place]?    No data found.  Updated Vital Signs BP 135/87   Pulse (!) 102   Temp 98 F (36.7 C)   Resp 18   LMP 10/18/2022   SpO2 97%   Visual Acuity Right Eye Distance:   Left Eye Distance:   Bilateral Distance:    Right Eye Near:   Left Eye Near:  Bilateral Near:     Physical Exam Vitals and nursing note reviewed.  Constitutional:      General: She is not in acute distress.    Appearance: Normal appearance. She is well-developed. She is not ill-appearing.  HENT:     Mouth/Throat:     Mouth: Mucous membranes are moist.  Cardiovascular:     Rate and Rhythm: Normal rate and regular rhythm.     Heart sounds: Normal heart sounds.  Pulmonary:     Effort: Pulmonary effort is normal. No respiratory distress.     Breath sounds: Normal breath sounds.  Abdominal:     General: Bowel sounds are normal.     Palpations: Abdomen is soft.     Tenderness: There is no abdominal tenderness. There is no right CVA tenderness, left CVA tenderness, guarding or rebound.  Musculoskeletal:     Cervical back: Neck supple.  Skin:    General: Skin is warm and dry.  Neurological:     Mental Status: She is alert.  Psychiatric:        Mood and Affect: Mood normal.        Behavior: Behavior normal.      UC Treatments / Results  Labs (all labs ordered are listed, but only abnormal results are displayed) Labs  Reviewed  POCT URINALYSIS DIP (MANUAL ENTRY) - Abnormal; Notable for the following components:      Result Value   Color, UA orange (*)    Glucose, UA =100 (*)    Bilirubin, UA small (*)    Ketones, POC UA trace (5) (*)    Blood, UA small (*)    Protein Ur, POC =100 (*)    Urobilinogen, UA 2.0 (*)    Nitrite, UA Positive (*)    Leukocytes, UA Large (3+) (*)    All other components within normal limits  URINE CULTURE  POCT URINE PREGNANCY    EKG   Radiology No results found.  Procedures Procedures (including critical care time)  Medications Ordered in UC Medications - No data to display  Initial Impression / Assessment and Plan / UC Course  I have reviewed the triage vital signs and the nursing notes.  Pertinent labs & imaging results that were available during my care of the patient were reviewed by me and considered in my medical decision making (see chart for details).   UTI.  Treating with Keflex. Urine culture pending. Discussed with patient that we will call her if the urine culture shows the need to change or discontinue the antibiotic. Instructed her to follow-up with her PCP if her symptoms are not improving. Patient agrees to plan of care.      Final Clinical Impressions(s) / UC Diagnoses   Final diagnoses:  Urinary tract infection without hematuria, site unspecified     Discharge Instructions      Take the antibiotic as directed.  The urine culture is pending.  We will call you if it shows the need to change or discontinue your antibiotic.    Follow up with your primary care provider if your symptoms are not improving.        ED Prescriptions     Medication Sig Dispense Auth. Provider   cephALEXin (KEFLEX) 500 MG capsule Take 1 capsule (500 mg total) by mouth 2 (two) times daily for 5 days. 10 capsule Sharion Balloon, NP      PDMP not reviewed this encounter.   Sharion Balloon, NP 11/20/22 [phone]

## 2022-11-20 NOTE — Discharge Instructions (Addendum)
Take the antibiotic as directed.  The urine culture is pending.  We will call you if it shows the need to change or discontinue your antibiotic.   ° °Follow up with your primary care provider if your symptoms are not improving.   ° ° °

## 2022-11-20 NOTE — ED Triage Notes (Signed)
Patient to Urgent Care with complaints of urinary frequency and dysuria. Denies any fevers. Denies any vaginal discharge.   Symptoms started yesterday. Taking azo.   Recently finished augmentin for a sinus infection.

## 2022-11-23 LAB — URINE CULTURE: Culture: 30000 — AB

## 2022-11-24 ENCOUNTER — Telehealth (HOSPITAL_COMMUNITY): Payer: Self-pay | Admitting: Emergency Medicine

## 2022-11-24 MED ORDER — NITROFURANTOIN MONOHYD MACRO 100 MG PO CAPS: 100.0000 mg | ORAL_CAPSULE | Freq: Two times a day (BID) | ORAL | 0 refills | Status: AC

## 2022-11-24 MED ORDER — NITROFURANTOIN MONOHYD MACRO 100 MG PO CAPS: 100.0000 mg | ORAL_CAPSULE | Freq: Two times a day (BID) | ORAL | 0 refills | Status: DC

## 2023-04-05 IMAGING — DX DG CHEST 2V
2 series · 2 of 2 positions shown · non-contrast
Comparison: None.

CLINICAL DATA: Productive cough

EXAM:
CHEST - 2 VIEW

[chest pa]
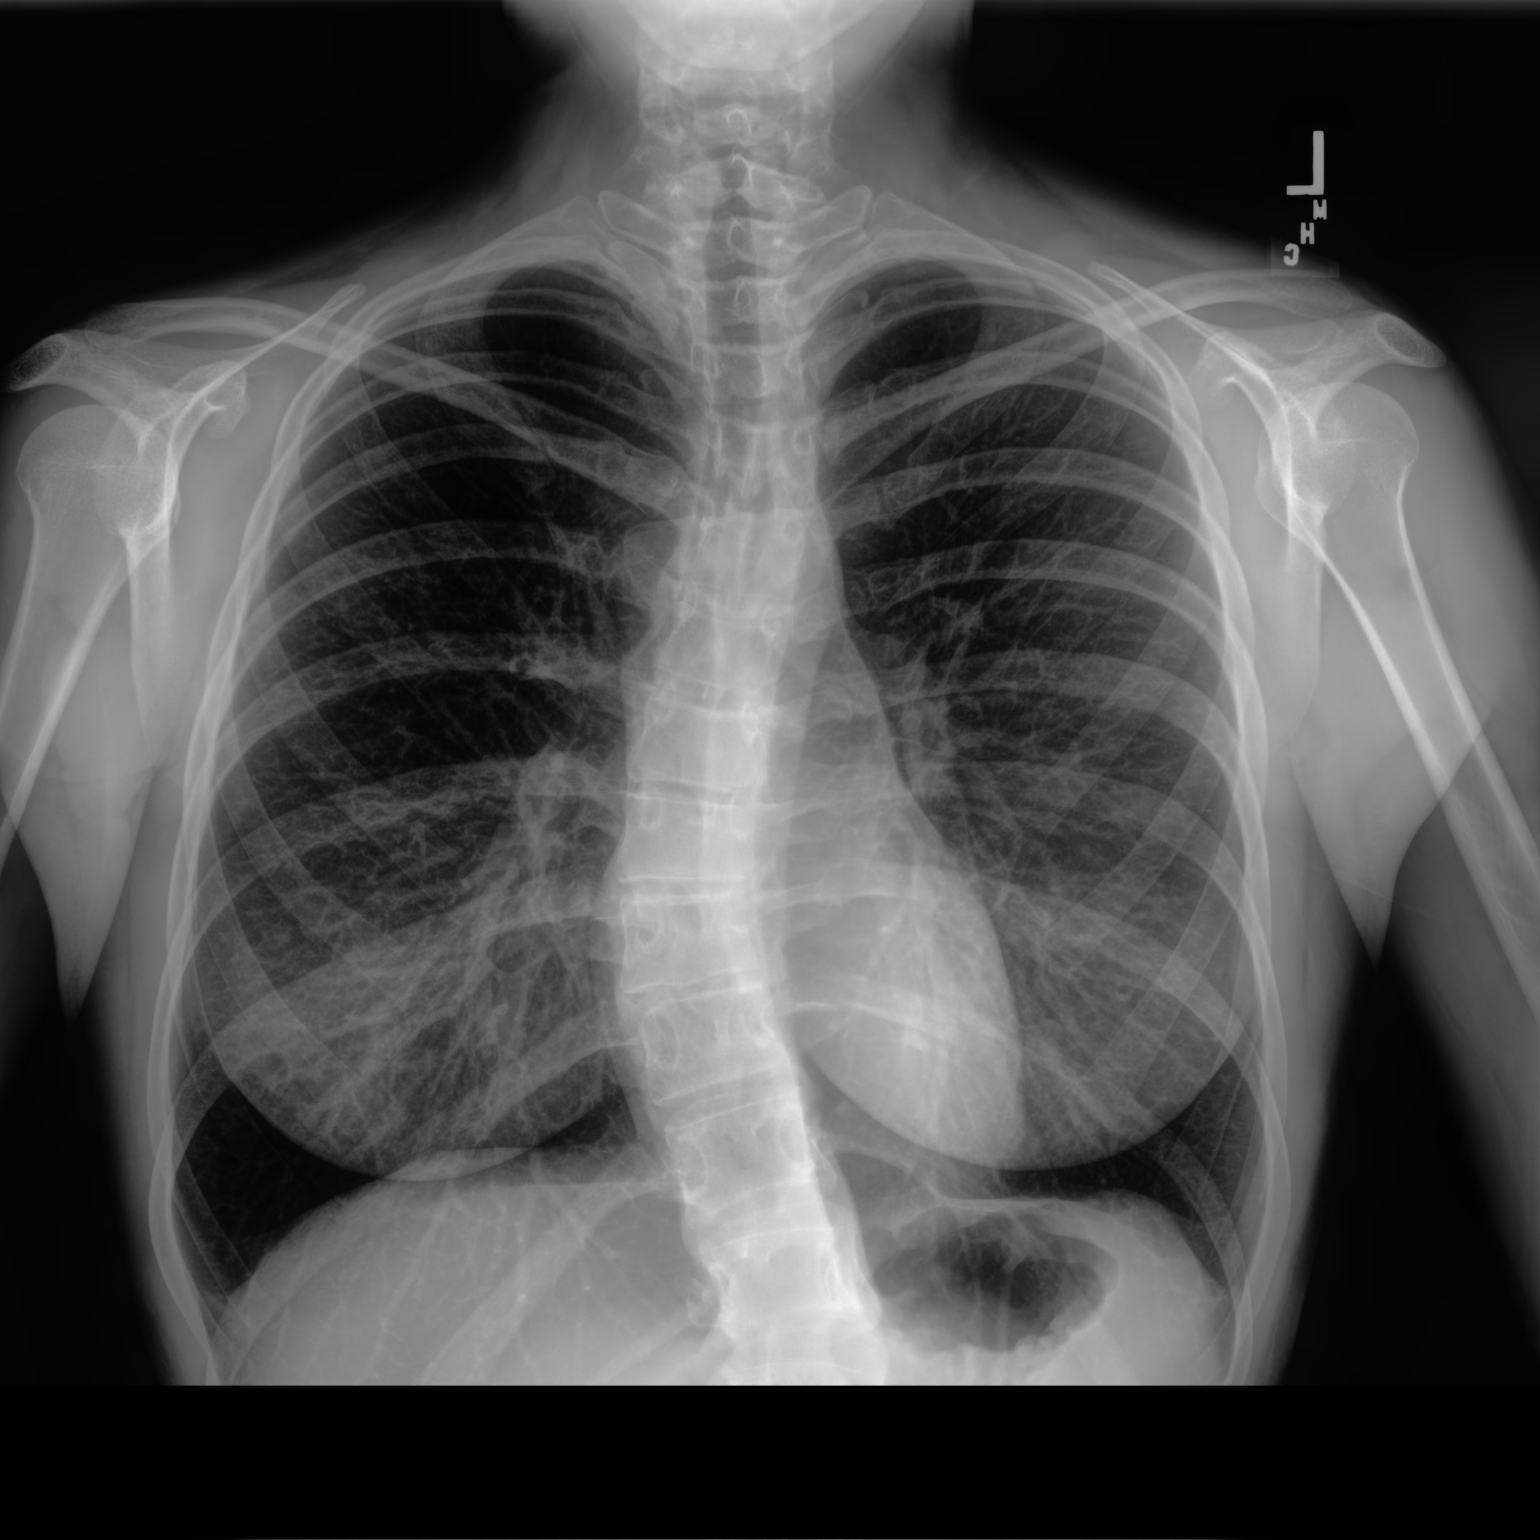

[chest lat]
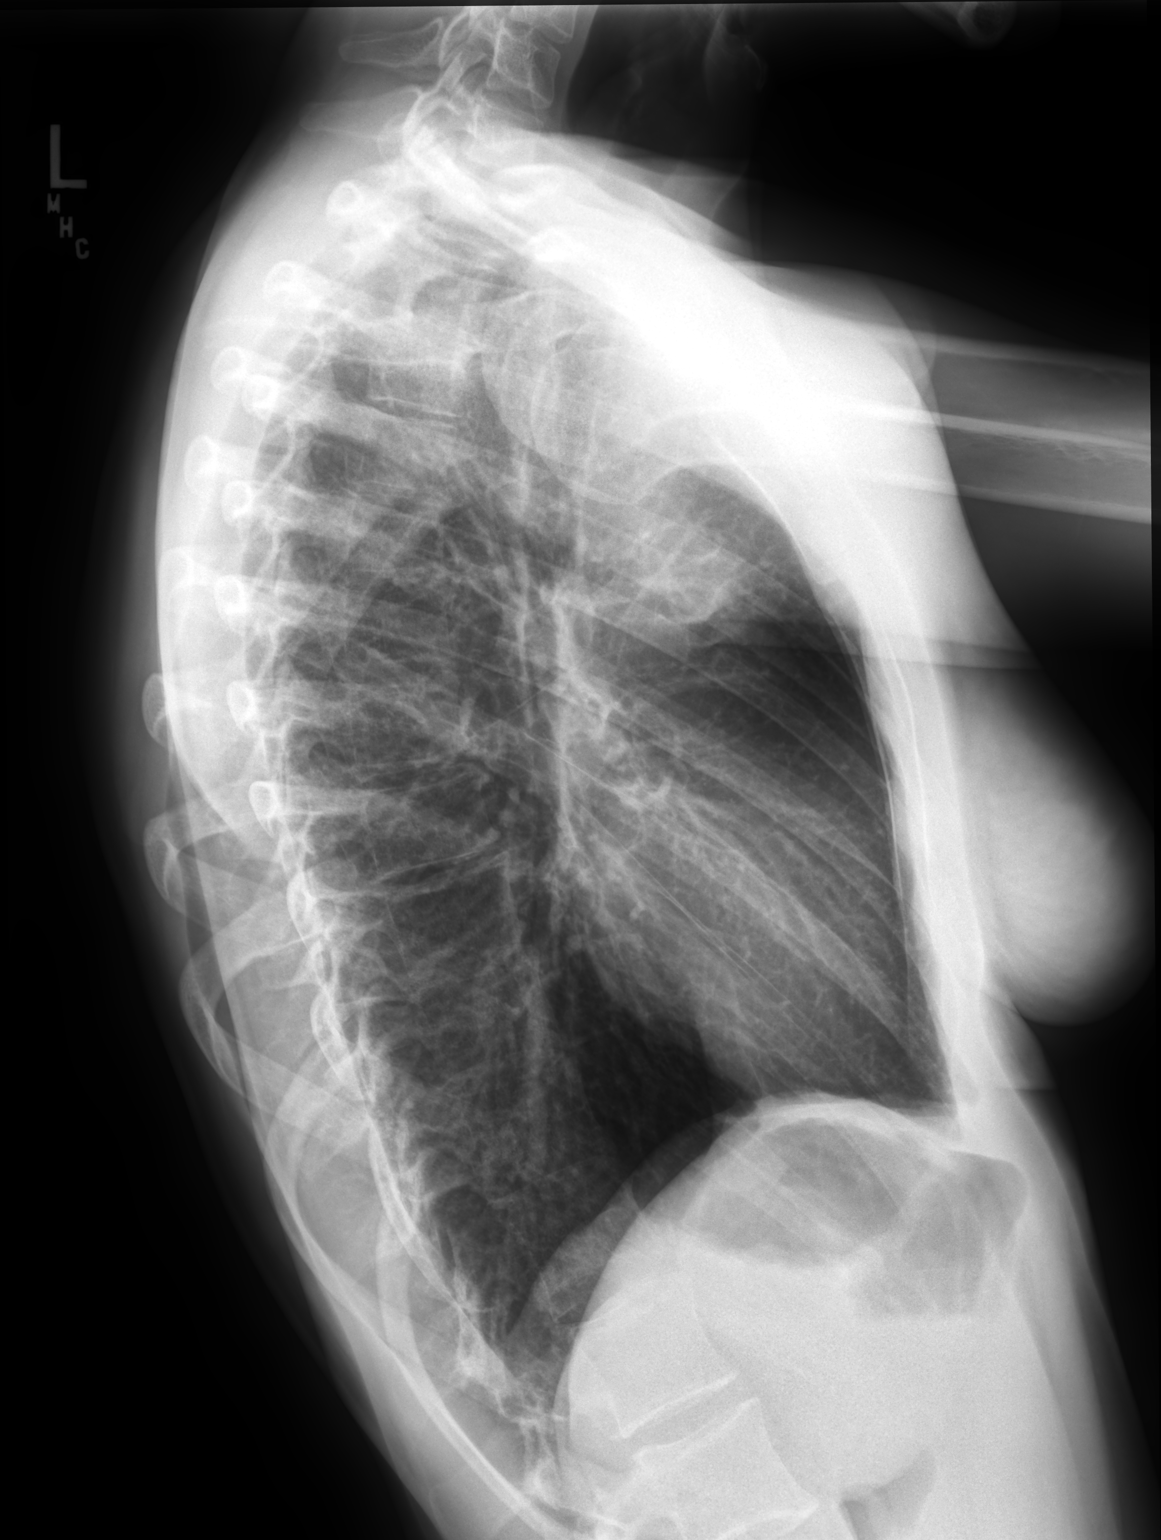

[2 of 2 positions shown; findings below may reference images not displayed]

FINDINGS: Slightly overpenetrated examination. The heart size and mediastinal
contours are within normal limits. No focal airspace consolidation,
pleural effusion, or pneumothorax. S-shaped scoliotic spinal
curvature.
IMPRESSION: No active cardiopulmonary disease.
